# Patient Record
Sex: Male | Born: 1995 | Race: Black or African American | Hispanic: No | Marital: Single | Smoking: Never smoker
Health system: Southern US, Community
[De-identification: ages and names within clinical notes are randomized; demographics above are authoritative.]

---
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
## 2020-09-01 ENCOUNTER — Ambulatory Visit (HOSPITAL_COMMUNITY)
Admission: EM | Admit: 2020-09-01 | Discharge: 2020-09-01 | Disposition: A | Discharge status: Home / Self Care | Payer: PRIVATE HEALTH INSURANCE

## 2020-09-01 ENCOUNTER — Other Ambulatory Visit: Payer: Self-pay

## 2020-09-01 DIAGNOSIS — F1410 Cocaine abuse, uncomplicated: Secondary | ICD-10-CM | POA: Diagnosis not present

## 2020-09-01 NOTE — Discharge Instructions (Addendum)

## 2020-09-01 NOTE — BH Assessment (Addendum)
Pt to [facility] voluntarily requesting rehab/detox treatment. Referred by a friend. Pt reports smoking fentanyl or heroin (he not sure which one) multiple times a day for the past 7 months. Used today. Pt denies hx of rehab/detox treatment and does not have a psych history. Pt reports his motivation for treatment is so he can get back to "normal" get a job and stop wasting money. Pt denies SI, HI, AVH.  Pt is routine.

## 2020-09-01 NOTE — ED Provider Notes (Addendum)
[facility] Medical Screening Exam  Patient Name: Seth Perkins MRN: [id number] Date of Evaluation: 09/02/20 Chief Complaint:   Diagnosis:  Final diagnoses:  None    History of Present illness: Seth Perkins is a 25 y.o. male. Patient presented to [facility] request resources for rehab/detox. Patient report hx of opiate abuse from May to Oct 2021. He report he stopped using opiate after traveling to [place], [place]. He report he relapsed and started using fentanyl or heroin (he is unsure of which one), he last used fentanyl/heroine today prior to this assessment. He is interested in out patient resources for detox and stated "I want to get clean so I can live a normal life."  He denies recent or current stress; he also denies depressive symptoms and depress mood. He denies history of self-harming or suicidal attempt; he denies access to firearms/guns/weapons. He denies SI, HI, AVH, paranioa, and no delusional thought content present during this assessment. He report that he currently resides at home with his family (mom and siblings).    Psychiatric Specialty Exam  Presentation  General Appearance:Appropriate for Environment  Eye Contact:Good  Speech:Clear and Coherent  Speech Volume:Normal  Handedness:Right   Mood and Affect  Mood:Euthymic  Affect:Congruent   Thought Process  Thought Processes:Coherent  Descriptions of Associations:Intact  Orientation:Full (Time, Place and Person)  Thought Content:Logical; WDL    Hallucinations:None  Ideas of Reference:None  Suicidal Thoughts:No  Homicidal Thoughts:No   Sensorium  Memory:Immediate Good; Recent Good; Remote Good  Judgment:Good  Insight:Good   Executive Functions  Concentration:Good  Attention Span:Good  Recall:Good  Fund of Knowledge:Good  Language:Good   Psychomotor Activity  Psychomotor Activity:Normal   Assets  Assets:Communication Skills; Desire for Improvement; Housing;  Physical Health; Resilience   Sleep  Sleep:Fair  Number of hours: 6   No data recorded  Physical Exam: Physical Exam Vitals and nursing note reviewed.  Constitutional:      Appearance: He is well-developed.  HENT:     Head: Normocephalic and atraumatic.  Eyes:     Conjunctiva/sclera: Conjunctivae normal.  Cardiovascular:     Rate and Rhythm: Normal rate.  Pulmonary:     Effort: Pulmonary effort is normal. No respiratory distress.     Breath sounds: Normal breath sounds. No wheezing.  Chest:     Chest wall: No tenderness.  Abdominal:     General: There is no distension.     Palpations: Abdomen is soft.     Tenderness: There is no abdominal tenderness.  Musculoskeletal:        General: Normal range of motion.     Cervical back: Neck supple.  Skin:    General: Skin is warm and dry.  Neurological:     Mental Status: He is alert and oriented to person, place, and time.  Psychiatric:        Attention and Perception: Attention normal. He is attentive. He does not perceive auditory or visual hallucinations.        Mood and Affect: Mood normal. Mood is not anxious or depressed.        Speech: Speech normal.        Behavior: Behavior normal. Behavior is cooperative.        Thought Content: Thought content normal. Thought content is not paranoid or delusional. Thought content does not include homicidal or suicidal ideation. Thought content does not include homicidal or suicidal plan.        Cognition and Memory: Cognition normal.  Judgment: Judgment normal.   Review of Systems  Constitutional: Negative.  Negative for chills and fever.  HENT: Negative.    Respiratory: Negative.  Negative for cough, shortness of breath and wheezing.   Cardiovascular:  Negative for chest pain and palpitations.  Gastrointestinal: Negative.   Genitourinary: Negative.   Musculoskeletal: Negative.   Skin: Negative.   Neurological: Negative.   Endo/Heme/Allergies: Negative.    Psychiatric/Behavioral:  Positive for substance abuse. Negative for depression, hallucinations, memory loss and suicidal ideas. The patient is not nervous/anxious and does not have insomnia.   Blood pressure 97/64, pulse 84, temperature 98 F (36.7 C), temperature source Oral, resp. rate 16, SpO2 98 %. There is no height or weight on file to calculate BMI.  Musculoskeletal: Strength & Muscle Tone: within normal limits Gait & Station: normal Patient leans: Right   [facility] MSE Discharge Disposition for Follow up and Recommendations: Based on my evaluation the patient does not appear to have an emergency medical condition and can be discharged with resources and follow up care in outpatient services for Substance Abuse Intensive Outpatient Program.   Maricela Bo, NP 09/02/2020, 5:54 AM

## 2020-09-01 NOTE — ED Notes (Signed)
Pt discharged in no acute distress. A&O x4, ambulatory. Denied SI/HI/AVH. Verbalized understanding of AVS instructions reviewed by RN. Belongings returned to pt intact from black locker. Pt escorted to front lobby by staff. Safety maintained.  

## 2022-01-18 ENCOUNTER — Emergency Department (HOSPITAL_COMMUNITY): Payer: PRIVATE HEALTH INSURANCE

## 2022-01-18 ENCOUNTER — Emergency Department (HOSPITAL_COMMUNITY)
Admission: EM | Admit: 2022-01-18 | Discharge: 2022-01-18 | Disposition: A | Discharge status: Home / Self Care | Payer: PRIVATE HEALTH INSURANCE | Attending: Emergency Medicine | Admitting: Emergency Medicine

## 2022-01-18 ENCOUNTER — Encounter (HOSPITAL_COMMUNITY): Payer: Self-pay | Admitting: Emergency Medicine

## 2022-01-18 ENCOUNTER — Other Ambulatory Visit: Payer: Self-pay

## 2022-01-18 DIAGNOSIS — S63502A Unspecified sprain of left wrist, initial encounter: Secondary | ICD-10-CM | POA: Insufficient documentation

## 2022-01-18 DIAGNOSIS — Y9366 Activity, soccer: Secondary | ICD-10-CM | POA: Diagnosis not present

## 2022-01-18 DIAGNOSIS — S6992XA Unspecified injury of left wrist, hand and finger(s), initial encounter: Secondary | ICD-10-CM | POA: Diagnosis present

## 2022-01-18 DIAGNOSIS — W19XXXA Unspecified fall, initial encounter: Secondary | ICD-10-CM | POA: Diagnosis not present

## 2022-01-18 MED ORDER — KETOROLAC TROMETHAMINE 60 MG/2ML IM SOLN: 60.0000 mg | Freq: Once | INTRAMUSCULAR | Status: DC

## 2022-01-18 NOTE — ED Notes (Signed)
Patient called x2 for vitals recheck with no response  

## 2022-01-18 NOTE — ED Provider Notes (Addendum)
[facility] EMERGENCY DEPARTMENT Provider Note   CSN: [id number] Arrival date & time: 01/18/22  0347     History  Chief Complaint  Patient presents with   Wrist Injury     Seth Perkins is a 26 y.o. male.  HPI   26 year old right handed male presenting to the emergency department with left hand/wrist pain after FOOSH yesterday.  The patient states that he was playing soccer when he lost his balance and landed on his left hand.  He endorses some swelling and additionally has had some pain with motion.  He tried a wrap at home without relief.  He denies any head injury or loss of consciousness.  Denies any other injuries or complaints.  Home Medications Prior to Admission medications   Not on File      Allergies    Patient has no known allergies.    Review of Systems   Review of Systems  All other systems reviewed and are negative.   Physical Exam Updated Vital Signs BP 113/75   Pulse 82   Temp 97.8 F (36.6 C) (Oral)   Resp 16   SpO2 96%  Physical Exam Vitals and nursing note reviewed.  Constitutional:      General: He is not in acute distress. HENT:     Head: Normocephalic and atraumatic.  Eyes:     Conjunctiva/sclera: Conjunctivae normal.     Pupils: Pupils are equal, round, and reactive to light.  Cardiovascular:     Rate and Rhythm: Normal rate and regular rhythm.  Pulmonary:     Effort: Pulmonary effort is normal. No respiratory distress.  Abdominal:     General: There is no distension.     Tenderness: There is no guarding.  Musculoskeletal:        General: No deformity or signs of injury.     Cervical back: Neck supple.     Comments: Left wrist NVI, tenderness about the base of the 5th metacarpal. No anatomic snuffbox tenderness. Minimal swelling, no significant wrist TTP.  Skin:    Findings: No lesion or rash.  Neurological:     General: No focal deficit present.     Mental Status: He is alert. Mental status is at baseline.      ED Results / Procedures / Treatments   Labs (all labs ordered are listed, but only abnormal results are displayed) Labs Reviewed - No data to display  EKG None  Radiology DG Hand Complete Left  Result Date: 01/18/2022 CLINICAL DATA:  Injury to left hand while playing soccer. EXAM: LEFT HAND - COMPLETE 3+ VIEW COMPARISON:  None Available. FINDINGS: There is no acute fracture or dislocation. Bony alignment is normal. The joint spaces are preserved. There is no erosive change. The soft tissues are unremarkable. IMPRESSION: Normal hand radiographs Electronically Signed   By: Lesia Hausen M.D.   On: 01/18/2022 09:55   DG Wrist Complete Left  Result Date: 01/18/2022 CLINICAL DATA:  Fall on outstretched hand while playing soccer. EXAM: LEFT WRIST - COMPLETE 3+ VIEW COMPARISON:  None Available. FINDINGS: There is no evidence of fracture or dislocation. There is no evidence of arthropathy or other focal bone abnormality. Mild soft tissue swelling at the wrist. IMPRESSION: No acute fracture or dislocation. Electronically Signed   By: Thornell Sartorius M.D.   On: 01/18/2022 04:16    Procedures Procedures    Medications Ordered in ED Medications  ketorolac (TORADOL) injection 60 mg (has no administration in time range)  ED Course/ Medical Decision Making/ A&P                           Medical Decision Making Amount and/or Complexity of Data Reviewed Radiology: ordered.  Risk Prescription drug management.    26 year old right handed male presenting to the emergency department with left hand/wrist pain after FOOSH yesterday.  The patient states that he was playing soccer when he lost his balance and landed on his left hand.  He endorses some swelling and additionally has had some pain with motion.  He tried a wrap at home without relief.  He denies any head injury or loss of consciousness.  Denies any other injuries or complaints.  On arrival, the patient was vitally stable. Physical  exam significant for left wrist NVI, tenderness about the base of the 5th metacarpal. No anatomic snuffbox tenderness. Minimal swelling, no significant wrist TTP.  XR imaging of the wrist and hand was performed which was negative for acute fracture or dislocation.  Patient's symptoms are consistent with likely wrist strain.  A wrist brace was placed for comfort.  Recommended Tylenol, ibuprofen, rest, ice, elevation of the extremity, follow-up with sports medicine as needed for repeat outpatient assessment.  Final Clinical Impression(s) / ED Diagnoses Final diagnoses:  Fall, initial encounter  Sprain of left wrist, initial encounter    Rx / DC Orders ED Discharge Orders     None         Ernie Avena, MD 01/18/22 1003    Ernie Avena, MD 01/18/22 1005

## 2022-01-18 NOTE — Discharge Instructions (Addendum)
Your x-ray imaging was negative for fracture or dislocation.  Your symptoms are consistent with likely wrist strain.  Recommend Tylenol and ibuprofen at home, ice, elevation of the extremity, rest.  A splint has been placed as needed for comfort.  You can follow-up as needed with sports medicine outpatient if you have persistent symptoms of pain.

## 2022-01-18 NOTE — ED Triage Notes (Signed)
Patient injured his left wrist this evening while playing soccer , pt. stated he lost his balance and landed on his left hand . Mild swelling with pain when moving .

## 2022-01-18 NOTE — ED Provider Triage Note (Signed)
Emergency Medicine Provider Triage Evaluation Note  Seth Perkins , a 26 y.o. male  was evaluated in triage.  Pt complains of left wrist pain after FOOSH yesterday while playing soccer.  No head injury or LOC.  Applied wrap at home without relief.  Right hand dominant..  Review of Systems  Positive: Wrist pain Negative: fever  Physical Exam  BP 113/75   Pulse 82   Temp 97.8 F (36.6 C) (Oral)   Resp 16   SpO2 96%  Gen:   Awake, no distress   Resp:  Normal effort  MSK:   Moves extremities without difficulty  Other:  Ace wrap applied to left wrist, radial pulse intact, able to wiggle fingers as normal  Medical Decision Making  Medically screening exam initiated at 3:53 AM.  Appropriate orders placed.  Seth Perkins was informed that the remainder of the evaluation will be completed by another provider, this initial triage assessment does not replace that evaluation, and the importance of remaining in the ED until their evaluation is complete.  FOOSH injury left wrist.  No other injuries.  X-ray ordered.   Garlon Hatchet, PA-C 01/18/22 [phone]

## 2023-04-23 ENCOUNTER — Emergency Department (HOSPITAL_COMMUNITY)
Admission: EM | Admit: 2023-04-23 | Discharge: 2023-04-23 | Disposition: A | Discharge status: Home / Self Care | Payer: Self-pay | Attending: Emergency Medicine | Admitting: Emergency Medicine

## 2023-04-23 ENCOUNTER — Other Ambulatory Visit: Payer: Self-pay

## 2023-04-23 ENCOUNTER — Encounter (HOSPITAL_COMMUNITY): Payer: Self-pay

## 2023-04-23 DIAGNOSIS — H9201 Otalgia, right ear: Secondary | ICD-10-CM | POA: Insufficient documentation

## 2023-04-23 LAB — CBC WITH DIFFERENTIAL/PLATELET
Abs Immature Granulocytes: 0.01 10*3/uL (ref 0.00–0.07)
Basophils Absolute: 0.1 10*3/uL (ref 0.0–0.1)
Basophils Relative: 1 %
Eosinophils Absolute: 0.1 10*3/uL (ref 0.0–0.5)
Eosinophils Relative: 2 %
HCT: 41.7 % (ref 39.0–52.0)
Hemoglobin: 14.1 g/dL (ref 13.0–17.0)
Immature Granulocytes: 0 %
Lymphocytes Relative: 36 %
Lymphs Abs: 1.9 10*3/uL (ref 0.7–4.0)
MCH: 28.8 pg (ref 26.0–34.0)
MCHC: 33.8 g/dL (ref 30.0–36.0)
MCV: 85.1 fL (ref 80.0–100.0)
Monocytes Absolute: 0.5 10*3/uL (ref 0.1–1.0)
Monocytes Relative: 10 %
Neutro Abs: 2.8 10*3/uL (ref 1.7–7.7)
Neutrophils Relative %: 51 %
Platelets: 277 10*3/uL (ref 150–400)
RBC: 4.9 MIL/uL (ref 4.22–5.81)
RDW: 12.2 % (ref 11.5–15.5)
WBC: 5.4 10*3/uL (ref 4.0–10.5)
nRBC: 0 % (ref 0.0–0.2)

## 2023-04-23 LAB — COMPREHENSIVE METABOLIC PANEL
ALT: 13 U/L (ref 0–44)
AST: 26 U/L (ref 15–41)
Albumin: 4.4 g/dL (ref 3.5–5.0)
Alkaline Phosphatase: 81 U/L (ref 38–126)
Anion gap: 7 (ref 5–15)
BUN: 9 mg/dL (ref 6–20)
CO2: 28 mmol/L (ref 22–32)
Calcium: 9.7 mg/dL (ref 8.9–10.3)
Chloride: 100 mmol/L (ref 98–111)
Creatinine, Ser: 0.76 mg/dL (ref 0.61–1.24)
GFR, Estimated: >60 mL/min (ref >60)
Glucose, Bld: 82 mg/dL (ref 70–99)
Potassium: 3.6 mmol/L (ref 3.5–5.1)
Sodium: 135 mmol/L (ref 135–145)
Total Bilirubin: 0.7 mg/dL (ref 0.0–1.2)
Total Protein: 8.1 g/dL (ref 6.5–8.1)

## 2023-04-23 MED ORDER — BACITRACIN ZINC 500 UNIT/GM EX OINT
TOPICAL_OINTMENT | Freq: Two times a day (BID) | CUTANEOUS | Status: DC
  Administered 2023-04-23: 2 via TOPICAL
  Filled 2023-04-23: qty 1.8

## 2023-04-23 MED ORDER — CEPHALEXIN 500 MG PO CAPS: 500.0000 mg | ORAL_CAPSULE | Freq: Three times a day (TID) | ORAL | 0 refills | Status: AC

## 2023-04-23 NOTE — Discharge Instructions (Signed)
 Keep your ear clean and dry.  Use the ointment twice a day for a week.  Be sure to take the oral antibiotic as well.  Follow-up with the physician listed if no improvement in a week

## 2023-04-23 NOTE — ED Triage Notes (Signed)
 Pt came in for right ear infection that started five days ago. Pt stated "I was cleaning my ear and scraped it, then it started to drain a couple days later".

## 2023-04-23 NOTE — ED Provider Notes (Signed)
  [place] EMERGENCY DEPARTMENT AT [facility] Provider Note   CSN: [id number] Arrival date & time: 04/23/23  0413     History  Chief Complaint  Patient presents with   Right Ear Infection    Seth Perkins is a 28 y.o. male.  The history is provided by the patient.  Patient reports he injured his right ear several days ago.  He reports he was using a device he ordered online and he was cleaning his ear and he thinks he injured it.  He reports drainage and pain.  No fevers or vomiting.     Home Medications Prior to Admission medications   Medication Sig Start Date End Date Taking? Authorizing Provider  cephALEXin (KEFLEX) 500 MG capsule Take 1 capsule (500 mg total) by mouth 3 (three) times daily. 04/23/23  Yes Zadie Rhine, MD      Allergies    Patient has no known allergies.    Review of Systems   Review of Systems  Physical Exam Updated Vital Signs BP 119/83 (BP Location: Right Arm)   Pulse 82   Temp 97.7 F (36.5 C) (Oral)   Resp 17   Ht 1.88 m (6\' 2" )   Wt 70.3 kg   SpO2 100%   BMI 19.90 kg/m  Physical Exam CONSTITUTIONAL: Well developed/well nourished HEAD: Normocephalic/atraumatic EYES: EOMI/PERRL No periorbital rash ENMT: Mucous membranes moist Other than acne, there is no rash to his face. Ears are symmetric.  No mastoid tenderness or bogginess.  Left TM is clear and intact Right TM is obscured by cerumen.  There is no bloody discharge.  There is scabbing and erythema noted to the external auditory canal and tragus NECK: supple no meningeal signs NEURO: Pt is awake/alert/appropriate, moves all extremitiesx4.  No facial droop.  He is walking around the room in no distress   ED Results / Procedures / Treatments   Labs (all labs ordered are listed, but only abnormal results are displayed) Labs Reviewed  COMPREHENSIVE METABOLIC PANEL  CBC WITH DIFFERENTIAL/PLATELET    EKG None  Radiology No results  found.  Procedures Procedures    Medications Ordered in ED Medications  bacitracin ointment (has no administration in time range)    ED Course/ Medical Decision Making/ A&P                                 Medical Decision Making Amount and/or Complexity of Data Reviewed Labs: ordered.  Risk OTC drugs. Prescription drug management.   Patient with superficial infection after trying to clean his ears at home. Plan to start topical antibiotics to the scab, and oral antibiotics.  Refer to otolaryngology if no improvement in a week        Final Clinical Impression(s) / ED Diagnoses Final diagnoses:  Right ear pain    Rx / DC Orders ED Discharge Orders          Ordered    cephALEXin (KEFLEX) 500 MG capsule  3 times daily        04/23/23 0545              Zadie Rhine, MD 04/23/23 [phone]
